# Patient Record
Sex: Male | Born: 1955 | Race: White | Hispanic: No | Marital: Married | Smoking: Former smoker
Health system: Southern US, Community
[De-identification: ages and names within clinical notes are randomized; demographics above are authoritative.]

## PROBLEM LIST (undated history)

## (undated) DIAGNOSIS — Z87891 Personal history of nicotine dependence: Secondary | ICD-10-CM

## (undated) DIAGNOSIS — H269 Unspecified cataract: Secondary | ICD-10-CM

## (undated) HISTORY — PX: WISDOM TOOTH EXTRACTION: SHX21

## (undated) HISTORY — DX: Unspecified cataract: H26.9

## (undated) HISTORY — DX: Personal history of nicotine dependence: Z87.891

---

## 1991-05-26 HISTORY — PX: BACK SURGERY: SHX140

## 2003-07-06 ENCOUNTER — Emergency Department (HOSPITAL_COMMUNITY): Admission: EM | Admit: 2003-07-06 | Discharge: 2003-07-06 | Payer: Self-pay | Admitting: Emergency Medicine
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
## 2013-05-31 ENCOUNTER — Emergency Department (HOSPITAL_COMMUNITY)
Admission: EM | Admit: 2013-05-31 | Discharge: 2013-05-31 | Disposition: A | Discharge status: Home / Self Care | Payer: Worker's Compensation | Attending: Emergency Medicine | Admitting: Emergency Medicine

## 2013-05-31 ENCOUNTER — Encounter (HOSPITAL_COMMUNITY): Payer: Self-pay | Admitting: Emergency Medicine

## 2013-05-31 DIAGNOSIS — S0990XA Unspecified injury of head, initial encounter: Secondary | ICD-10-CM

## 2013-05-31 DIAGNOSIS — Y929 Unspecified place or not applicable: Secondary | ICD-10-CM | POA: Insufficient documentation

## 2013-05-31 DIAGNOSIS — Z87891 Personal history of nicotine dependence: Secondary | ICD-10-CM | POA: Insufficient documentation

## 2013-05-31 DIAGNOSIS — S060X9A Concussion with loss of consciousness of unspecified duration, initial encounter: Secondary | ICD-10-CM | POA: Insufficient documentation

## 2013-05-31 DIAGNOSIS — W208XXA Other cause of strike by thrown, projected or falling object, initial encounter: Secondary | ICD-10-CM | POA: Insufficient documentation

## 2013-05-31 DIAGNOSIS — Y9389 Activity, other specified: Secondary | ICD-10-CM | POA: Insufficient documentation

## 2013-05-31 NOTE — Discharge Instructions (Signed)
Head Injury, Adult °You have had a head injury that does not appear serious at this time. A concussion is a state of changed mental ability, usually from a blow to the head. You should take clear liquids for the rest of the day and then resume your regular diet. You should not take sedatives or alcoholic beverages for as long as directed by your caregiver after discharge. After injuries such as yours, most problems occur within the first 24 hours. °SYMPTOMS °These minor symptoms may be experienced after discharge: °· Memory difficulties. °· Dizziness. °· Headaches. °· Double vision. °· Hearing difficulties. °· Depression. °· Tiredness. °· Weakness. °· Difficulty with concentration. °If you experience any of these problems, you should not be alarmed. A concussion requires a few days for recovery. Many patients with head injuries frequently experience such symptoms. Usually, these problems disappear without medical care. If symptoms last for more than one day, notify your caregiver. See your caregiver sooner if symptoms are becoming worse rather than better. °HOME CARE INSTRUCTIONS  °· During the next 24 hours you must stay with someone who can watch you for the warning signs listed below. °Although it is unlikely that serious side effects will occur, you should be aware of signs and symptoms which may necessitate your return to this location. Side effects may occur up to 7  10 days following the injury. It is important for you to carefully monitor your condition and contact your caregiver or seek immediate medical attention if there is a change in your condition. °SEEK IMMEDIATE MEDICAL CARE IF:  °· There is confusion or drowsiness. °· You can not awaken the injured person. °· There is nausea (feeling sick to your stomach) or continued, forceful vomiting. °· You notice dizziness or unsteadiness which is getting worse, or inability to walk. °· You have convulsions or unconsciousness. °· You experience severe,  persistent headaches not relieved by over-the-counter or prescription medicines for pain. (Do not take aspirin as this impairs clotting abilities). Take other pain medications only as directed. °· You can not use arms or legs normally. °· There is clear or bloody discharge from the nose or ears. °MAKE SURE YOU:  °· Understand these instructions. °· Will watch your condition. °· Will get help right away if you are not doing well or get worse. °Document Released: 05/11/2005 Document Revised: 08/03/2011 Document Reviewed: 03/29/2009 °ExitCare® Patient Information ©2014 [employer]. ° °
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
## 2013-05-31 NOTE — ED Provider Notes (Signed)
Medical screening examination/treatment/procedure(s) were performed by non-physician practitioner and as supervising physician I was immediately available for consultation/collaboration.  EKG Interpretation    Date/Time:  Wednesday May 31 2013 11:26:23 EST Ventricular Rate:  62 PR Interval:  205 QRS Duration: 99 QT Interval:  400 QTC Calculation: 406 R Axis:   38 Text Interpretation:  Sinus rhythm Borderline prolonged PR interval No previous ECGs available Confirmed by Stevie Kern  MD, Jenny Reichmann [phone]) on 05/31/2013 3:15:03 PM             Babette Relic, MD 05/31/13 [phone]

## 2013-05-31 NOTE — ED Notes (Signed)
Pt denies n/v/use of bld thinners. Pt sts he is feeling fine, just has a little pain on right forehead, tender with palpation. Nad, skin warm and dry, resp e/u. Pt is a&ox4.
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
## 2019-12-20 ENCOUNTER — Other Ambulatory Visit: Payer: Self-pay

## 2019-12-20 ENCOUNTER — Ambulatory Visit (INDEPENDENT_AMBULATORY_CARE_PROVIDER_SITE_OTHER): Payer: BC Managed Care – PPO | Admitting: Physician Assistant

## 2019-12-20 ENCOUNTER — Encounter: Payer: Self-pay | Admitting: Physician Assistant

## 2019-12-20 VITALS — BP 130/80 | HR 68 | Temp 97.6°F | Ht 73.0 in | Wt 278.4 lb

## 2019-12-20 DIAGNOSIS — S90851A Superficial foreign body, right foot, initial encounter: Secondary | ICD-10-CM | POA: Diagnosis not present

## 2019-12-20 DIAGNOSIS — Z1211 Encounter for screening for malignant neoplasm of colon: Secondary | ICD-10-CM

## 2019-12-20 DIAGNOSIS — Z23 Encounter for immunization: Secondary | ICD-10-CM

## 2019-12-20 DIAGNOSIS — F1411 Cocaine abuse, in remission: Secondary | ICD-10-CM | POA: Insufficient documentation

## 2019-12-20 DIAGNOSIS — B351 Tinea unguium: Secondary | ICD-10-CM

## 2019-12-20 HISTORY — DX: Cocaine abuse, in remission: F14.11

## 2019-12-20 NOTE — Progress Notes (Signed)
Brandon Moss is a 64 y.o. male is here to establish care.  I acted as a Education administrator for [employer], PA-C Anselmo Pickler, LPN   History of Present Illness:   Chief Complaint  Patient presents with  . Foreign Body in Skin    HPI   Splinter Pt c/o splinter in right foot x 2 months. Pt's wife tried to get it out. Painful at times with walking. Felt like he cleaned the area well when it happened. Denies discharge from the area, severe pain, erythema. He is behind on his tetanus vaccine.  Thickened toenail Has fungus on R great toenail. Has been getting thicker with time. Has difficulty cutting the nail. Has tried OTC remedies without much relief.  Colonoscopy He is interested in scheduling colonoscopy.   There are no preventive care reminders to display for this patient.  Past Medical History:  Diagnosis Date  . 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 Former smoker      Social History   Tobacco Use  . Smoking status: Former Smoker    Types: Cigarettes  . Smokeless tobacco: Never Used  . Tobacco comment: quit 11 yrs ago  Vaping Use  . Vaping Use: Never used  Substance Use Topics  . Alcohol use: Yes    Comment: weekends 2-3 beers  . Drug use: No    Past Surgical History:  Procedure Laterality Date  . BACK SURGERY  1993    Family History  Problem Relation Age of Onset  . Hypertension Mother   . Kidney cancer Mother   . Heart attack Mother   . Depression Mother   . Post-traumatic stress disorder Father     PMHx, SurgHx, SocialHx, FamHx, Medications, and Allergies were reviewed in the Visit Navigator and updated as appropriate.   There are no problems to display for this patient.   Social History   Tobacco Use  . Smoking status: Former Smoker    Types: Cigarettes  . Smokeless tobacco: Never Used  . Tobacco comment: quit 11 yrs ago  Vaping Use  . Vaping Use: Never used  Substance Use Topics  . Alcohol use: Yes    Comment: weekends 2-3  beers  . Drug use: No    Current Medications and Allergies:   No current outpatient medications on file.  No Known Allergies  Review of Systems   ROS  Negative unless otherwise specified per HPI.  Vitals:   Vitals:   12/20/19 1026  BP: (!) 130/80  Pulse: 68  Temp: 97.6 F (36.4 C)  TempSrc: Temporal  SpO2: 96%  Weight: (!) 278 lb 6.1 oz (126.3 kg)  Height: 6\' 1"  (1.854 m)     Body mass index is 36.73 kg/m.   Physical Exam:    Physical Exam Vitals and nursing note reviewed.  Constitutional:      Appearance: He is well-developed.  HENT:     Head: Normocephalic.  Eyes:     Conjunctiva/sclera: Conjunctivae normal.     Pupils: Pupils are equal, round, and reactive to light.  Pulmonary:     Effort: Pulmonary effort is normal.  Musculoskeletal:        General: Normal range of motion.     Cervical back: Normal range of motion.  Feet:     Comments: Tiny scab to plantar surface of base of fifth toe; thickened callous at this site  Thickened R great toenail Skin:    General: Skin is warm and dry.  Neurological:  Mental Status: He is alert and oriented to person, place, and time.  Psychiatric:        Behavior: Behavior normal.        Thought Content: Thought content normal.        Judgment: Judgment normal.    Removal of foreign body Meds, vitals, and allergies reviewed.  Indication: possible foreign body Pt complaints of: pain to foot Location: base of fifth toe on plantar surface of R foot Size: 17mm  Informed consent obtained.  Pt aware of risks not limited to but including infection, bleeding, damage to near by organs.  Prep: etoh  Scab removed with #11 blade. No blood loss.    Assessment and Plan:    Brandon Moss was seen today for foreign body in skin.  Diagnoses and all orders for this visit:  Splinter of right foot without infection, initial encounter Tolerated well. Unsure if foreign body completely removed. Offered xray and/or podiatry  referral, he declined and would like to see how he progresses with this intervention today. -     Tdap vaccine greater than or equal to 7yo IM  Toenail fungus Trial topical urea cream to see if this helps with thinning out nail.  Special screening for malignant neoplasms, colon -     Ambulatory referral to Gastroenterology    . Reviewed expectations re: course of current medical issues. . Discussed self-management of symptoms. . Outlined signs and symptoms indicating need for more acute intervention. . Patient verbalized understanding and all questions were answered. . See orders for this visit as documented in the electronic medical record. . Patient received an After Visit Summary.  CMA or LPN served as scribe during this visit. History, Physical, and Plan performed by medical provider. The above documentation has been reviewed and is accurate and complete.  Inda Coke, PA-C [place] 12/20/2019  Follow-up: No follow-ups on file.

## 2019-12-20 NOTE — Patient Instructions (Signed)
It was great to see you!     Trial this on your toenails -- use until desired thinness.  You will be contacted about your colonoscopy.  Follow-up at your convenience for a physical.  Take care,  Inda Coke PA-C

## 2020-01-12 ENCOUNTER — Encounter: Payer: Self-pay | Admitting: Physician Assistant

## 2020-01-12 ENCOUNTER — Ambulatory Visit (INDEPENDENT_AMBULATORY_CARE_PROVIDER_SITE_OTHER): Payer: BC Managed Care – PPO | Admitting: Physician Assistant

## 2020-01-12 ENCOUNTER — Other Ambulatory Visit: Payer: Self-pay

## 2020-01-12 VITALS — BP 160/90 | HR 70 | Temp 97.3°F | Ht 73.0 in | Wt 278.0 lb

## 2020-01-12 DIAGNOSIS — Z114 Encounter for screening for human immunodeficiency virus [HIV]: Secondary | ICD-10-CM | POA: Diagnosis not present

## 2020-01-12 DIAGNOSIS — E669 Obesity, unspecified: Secondary | ICD-10-CM | POA: Diagnosis not present

## 2020-01-12 DIAGNOSIS — R030 Elevated blood-pressure reading, without diagnosis of hypertension: Secondary | ICD-10-CM

## 2020-01-12 DIAGNOSIS — Z13220 Encounter for screening for lipoid disorders: Secondary | ICD-10-CM | POA: Diagnosis not present

## 2020-01-12 DIAGNOSIS — Z0001 Encounter for general adult medical examination with abnormal findings: Secondary | ICD-10-CM | POA: Diagnosis not present

## 2020-01-12 DIAGNOSIS — Z1211 Encounter for screening for malignant neoplasm of colon: Secondary | ICD-10-CM | POA: Diagnosis not present

## 2020-01-12 DIAGNOSIS — Z136 Encounter for screening for cardiovascular disorders: Secondary | ICD-10-CM

## 2020-01-12 DIAGNOSIS — Z1159 Encounter for screening for other viral diseases: Secondary | ICD-10-CM | POA: Diagnosis not present

## 2020-01-12 DIAGNOSIS — Z125 Encounter for screening for malignant neoplasm of prostate: Secondary | ICD-10-CM

## 2020-01-12 NOTE — Patient Instructions (Addendum)
It was great to see you!  1. Try to get on the treadmill weekly! Or go to the gym with your son! 2. I will resubmit your colonoscopy request. 3. Keep an eye on your blood pressure, if consistently >140/90, come back to see me!  Please go to the lab for blood work.   Our office will call you with your results unless you have chosen to receive results via MyChart.  If your blood work is normal we will follow-up each year for physicals and as scheduled for chronic medical problems.  If anything is abnormal we will treat accordingly and get you in for a follow-up.  Take care,  [facility] Maintenance, Male Adopting a healthy lifestyle and getting preventive care are important in promoting health and wellness. Ask your health care provider about:  The right schedule for you to have regular tests and exams.  Things you can do on your own to prevent diseases and keep yourself healthy. What should I know about diet, weight, and exercise? Eat a healthy diet   Eat a diet that includes plenty of vegetables, fruits, low-fat dairy products, and lean protein.  Do not eat a lot of foods that are high in solid fats, added sugars, or sodium. Maintain a healthy weight Body mass index (BMI) is a measurement that can be used to identify possible weight problems. It estimates body fat based on height and weight. Your health care provider can help determine your BMI and help you achieve or maintain a healthy weight. Get regular exercise Get regular exercise. This is one of the most important things you can do for your health. Most adults should:  Exercise for at least 150 minutes each week. The exercise should increase your heart rate and make you sweat (moderate-intensity exercise).  Do strengthening exercises at least twice a week. This is in addition to the moderate-intensity exercise.  Spend less time sitting. Even light physical activity can be beneficial. Watch cholesterol and blood  lipids Have your blood tested for lipids and cholesterol at 62 years of age, then have this test every 5 years. You may need to have your cholesterol levels checked more often if:  Your lipid or cholesterol levels are high.  You are older than 62 years of age.  You are at high risk for heart disease. What should I know about cancer screening? Many types of cancers can be detected early and may often be prevented. Depending on your health history and family history, you may need to have cancer screening at various ages. This may include screening for:  Colorectal cancer.  Prostate cancer.  Skin cancer.  Lung cancer. What should I know about heart disease, diabetes, and high blood pressure? Blood pressure and heart disease  High blood pressure causes heart disease and increases the risk of stroke. This is more likely to develop in people who have high blood pressure readings, are of African descent, or are overweight.  Talk with your health care provider about your target blood pressure readings.  Have your blood pressure checked: ? Every 3-5 years if you are 19-57 years of age. ? Every year if you are 65 years old or older.  If you are between the ages of 38 and 34 and are a current or former smoker, ask your health care provider if you should have a one-time screening for abdominal aortic aneurysm (AAA). Diabetes Have regular diabetes screenings. This checks your fasting blood sugar level. Have the screening  done:  Once every three years after age 49 if you are at a normal weight and have a low risk for diabetes.  More often and at a younger age if you are overweight or have a high risk for diabetes. What should I know about preventing infection? Hepatitis B If you have a higher risk for hepatitis B, you should be screened for this virus. Talk with your health care provider to find out if you are at risk for hepatitis B infection. Hepatitis C Blood testing is recommended  for:  Everyone born from [birth year] through 1963.  Anyone with known risk factors for hepatitis C. Sexually transmitted infections (STIs)  You should be screened each year for STIs, including gonorrhea and chlamydia, if: ? You are sexually active and are younger than 62 years of age. ? You are older than 62 years of age and your health care provider tells you that you are at risk for this type of infection. ? Your sexual activity has changed since you were last screened, and you are at increased risk for chlamydia or gonorrhea. Ask your health care provider if you are at risk.  Ask your health care provider about whether you are at high risk for HIV. Your health care provider may recommend a prescription medicine to help prevent HIV infection. If you choose to take medicine to prevent HIV, you should first get tested for HIV. You should then be tested every 3 months for as long as you are taking the medicine. Follow these instructions at home: Lifestyle  Do not use any products that contain nicotine or tobacco, such as cigarettes, e-cigarettes, and chewing tobacco. If you need help quitting, ask your health care provider.  Do not use street drugs.  Do not share needles.  Ask your health care provider for help if you need support or information about quitting drugs. Alcohol use  Do not drink alcohol if your health care provider tells you not to drink.  If you drink alcohol: ? Limit how much you have to 0-2 drinks a day. ? Be aware of how much alcohol is in your drink. In the [place] one drink equals one 12 oz bottle of beer (355 mL), one 5 oz glass of wine (148 mL), or one 1 oz glass of hard liquor (44 mL). General instructions  Schedule regular health, dental, and eye exams.  Stay current with your vaccines.  Tell your health care provider if: ? You often feel depressed. ? You have ever been abused or do not feel safe at home. Summary  Adopting a healthy lifestyle and getting  preventive care are important in promoting health and wellness.  Follow your health care provider's instructions about healthy diet, exercising, and getting tested or screened for diseases.  Follow your health care provider's instructions on monitoring your cholesterol and blood pressure. This information is not intended to replace advice given to you by your health care provider. Make sure you discuss any questions you have with your health care provider. Document Revised: 05/04/2018 Document Reviewed: 05/04/2018 Elsevier Patient Education  2020 [employer].

## 2020-01-12 NOTE — Progress Notes (Signed)
I acted as a Education administrator for [employer], PA-C Anselmo Pickler, LPN   Subjective:    Brandon Moss is a 64 y.o. male and is here for a comprehensive physical exam.   HPI  There are no preventive care reminders to display for this patient.  Acute Concerns: None  Chronic Issues: Elevated blood pressure  -- Currently taking no medications. At home blood pressure readings are: not checked. Patient denies chest pain, SOB, blurred vision, dizziness, unusual headaches, lower leg swelling. Patient is compliant with medication. Denies excessive caffeine intake, stimulant usage, excessive alcohol intake, or increase in salt consumption.  BP Readings from Last 3 Encounters:  01/12/20 (!) 160/90  12/20/19 (!) 130/80  05/31/13 131/83   Health Maintenance: Immunizations -- UTD Colonoscopy -- overdue PSA -- will update today Diet -- has been trying to do keto Caffeine intake -- a few large cups daily Sleep habits -- no concerns Exercise -- none; has treadmill in the house that he doesn't use Weight -- Weight: 278 lb (126.1 kg)  Recent weight history Wt Readings from Last 10 Encounters:  01/12/20 278 lb (126.1 kg)  12/20/19 (!) 278 lb 6.1 oz (126.3 kg)  05/31/13 265 lb (120.2 kg)   Body mass index is 36.68 kg/m. Mood -- fair Alcohol use -- doesn't drink daily; no concerns with alcohol intake Tobacco use -- none  Depression screen PHQ 2/9 01/12/2020  Decreased Interest 0  Down, Depressed, Hopeless 0  PHQ - 2 Score 0    Other providers/specialists: Patient Care Team: Inda Coke, [place] as PCP - General (Physician Assistant)    PMHx, SurgHx, SocialHx, Medications, and Allergies were reviewed in the Visit Navigator and updated as appropriate.   Past Medical History:  Diagnosis Date  . Cocaine abuse in remission ([place]) 12/20/2019  . Former smoker      Past Surgical History:  Procedure Laterality Date  . BACK SURGERY  1993     Family History  Problem Relation Age  of Onset  . Hypertension Mother   . Kidney cancer Mother   . Heart attack Mother   . Depression Mother   . Post-traumatic stress disorder Father     Social History   Tobacco Use  . Smoking status: Former Smoker    Types: Cigarettes  . Smokeless tobacco: Never Used  . Tobacco comment: quit 11 yrs ago  Vaping Use  . Vaping Use: Never used  Substance Use Topics  . Alcohol use: Yes    Comment: weekends 2-3 beers  . Drug use: No    Review of Systems:   Review of Systems  Constitutional: Negative for chills, fever, malaise/fatigue and weight loss.  HENT: Negative for hearing loss, sinus pain and sore throat.   Eyes: Negative for blurred vision.  Respiratory: Negative for cough and shortness of breath.   Cardiovascular: Negative for chest pain, palpitations and leg swelling.  Gastrointestinal: Negative for abdominal pain, constipation, diarrhea, heartburn, nausea and vomiting.  Genitourinary: Negative for dysuria, frequency and urgency.  Musculoskeletal: Negative for back pain, myalgias and neck pain.  Skin: Negative for itching and rash.  Neurological: Negative for dizziness, tingling, seizures, loss of consciousness and headaches.  Endo/Heme/Allergies: Negative for polydipsia.  Psychiatric/Behavioral: Negative for depression. The patient is not nervous/anxious.   All other systems reviewed and are negative.   Objective:    Vitals:   01/12/20 0804  BP: (!) 160/90  Pulse: 70  Temp: (!) 97.3 F (36.3 C)  SpO2: 95%   Body  mass index is 36.68 kg/m.  General  Alert, cooperative, no distress, appears stated age  Head:  Normocephalic, without obvious abnormality, atraumatic  Eyes:  PERRL, conjunctiva/corneas clear, EOM's intact, fundi benign, both eyes       Ears:  Normal TM's and external ear canals, both ears  Nose: Nares normal, septum midline, mucosa normal, no drainage or sinus tenderness  Throat: Lips, mucosa, and tongue normal; teeth and gums normal  Neck:  Supple, symmetrical, trachea midline, no adenopathy;     thyroid:  No enlargement/tenderness/nodules; no carotid bruit or JVD  Back:   Symmetric, no curvature, ROM normal, no CVA tenderness  Lungs:   Clear to auscultation bilaterally, respirations unlabored  Chest wall:  No tenderness or deformity  Heart:  Regular rate and rhythm, S1 and S2 normal, no murmur, rub or gallop  Abdomen:   Soft, non-tender, bowel sounds active all four quadrants, no masses, no organomegaly  Extremities: Extremities normal, atraumatic, no cyanosis or edema  Prostate : Not done   Skin: Skin color, texture, turgor normal, no rashes or lesions  Lymph nodes: Cervical, supraclavicular, and axillary nodes normal  Neurologic: CNII-XII grossly intact. Normal strength, sensation and reflexes throughout   No results found for this or any previous visit.  AssessmentPlan:   [place] was seen today for annual exam.  Diagnoses and all orders for this visit:  Encounter for general adult medical examination with abnormal findings Today patient counseled on age appropriate routine health concerns for screening and prevention, each reviewed and up to date or declined. Immunizations reviewed and up to date or declined. Labs ordered and reviewed. Risk factors for depression reviewed and negative. Hearing function and visual acuity are intact. ADLs screened and addressed as needed. Functional ability and level of safety reviewed and appropriate. Education, counseling and referrals performed based on assessed risks today. Patient provided with a copy of personalized plan for preventive services.  Special screening for malignant neoplasms, colon -     Ambulatory referral to Gastroenterology  Encounter for lipid screening for cardiovascular disease -     Lipid panel; Future -     Lipid panel  Encounter for screening for other viral diseases -     Hepatitis C antibody; Future -     Hepatitis C antibody  Screening for HIV (human  immunodeficiency virus) -     HIV Antibody (routine testing w rflx); Future -     HIV Antibody (routine testing w rflx)  Obesity, unspecified classification, unspecified obesity type, unspecified whether serious comorbidity present Counseled; continue to work on diet. Encouraged use of treadmill and continued activity around the house. -     CBC with Differential/Platelet; Future -     Comprehensive metabolic panel; Future -     Comprehensive metabolic panel -     CBC with Differential/Platelet  Elevated blood pressure reading Encouraged weight and modest caffeine reduction.  He has the ability to check his BP at work, provided parameters on what would prompt return visit.  Prostate cancer screening -     PSA; Future -     PSA   Well Adult Exam: Labs ordered: Yes. Patient counseling was done. See below for items discussed. Discussed the patient's BMI.  The BMI BMI is not in the acceptable range; BMI management plan is completed Follow up as needed for acute illness. Colon Cancer: re-submitted referral today for patient to be contacted  Patient Counseling: [x]   Nutrition: Stressed importance of moderation in sodium/caffeine intake, saturated fat  and cholesterol, caloric balance, sufficient intake of fresh fruits, vegetables, and fiber  [x]   Stressed the importance of regular exercise.   []   Substance Abuse: Discussed cessation/primary prevention of tobacco, alcohol, or other drug use; driving or other dangerous activities under the influence; availability of treatment for abuse.   [x]   Injury prevention: Discussed safety belts, safety helmets, smoke detector, smoking near bedding or upholstery.   []   Sexuality: Discussed sexually transmitted diseases, partner selection, use of condoms, avoidance of unintended pregnancy  and contraceptive alternatives.   [x]   Dental health: Discussed importance of regular tooth brushing, flossing, and dental visits.  [x]   Health maintenance and  immunizations reviewed. Please refer to Health maintenance section.   CMA or LPN served as scribe during this visit. History, Physical, and Plan performed by medical provider. The above documentation has been reviewed and is accurate and complete.  Inda Coke, PA-C [place]

## 2020-01-15 LAB — COMPREHENSIVE METABOLIC PANEL
AG Ratio: 2 (calc) (ref 1.0–2.5)
ALT: 16 U/L (ref 9–46)
AST: 16 U/L (ref 10–35)
Albumin: 4.5 g/dL (ref 3.6–5.1)
Alkaline phosphatase (APISO): 43 U/L (ref 35–144)
BUN: 16 mg/dL (ref 7–25)
CO2: 26 mmol/L (ref 20–32)
Calcium: 9.6 mg/dL (ref 8.6–10.3)
Chloride: 105 mmol/L (ref 98–110)
Creat: 1.09 mg/dL (ref 0.70–1.25)
Globulin: 2.2 g/dL (calc) (ref 1.9–3.7)
Glucose, Bld: 99 mg/dL (ref 65–99)
Potassium: 4.4 mmol/L (ref 3.5–5.3)
Sodium: 139 mmol/L (ref 135–146)
Total Bilirubin: 0.8 mg/dL (ref 0.2–1.2)
Total Protein: 6.7 g/dL (ref 6.1–8.1)

## 2020-01-15 LAB — PSA: PSA: 1.4 ng/mL (ref <=4.0)

## 2020-01-15 LAB — CBC WITH DIFFERENTIAL/PLATELET
Absolute Monocytes: 440 cells/uL (ref 200–950)
Basophils Absolute: 62 cells/uL (ref 0–200)
Basophils Relative: 1.4 %
Eosinophils Absolute: 158 cells/uL (ref 15–500)
Eosinophils Relative: 3.6 %
HCT: 45.7 % (ref 38.5–50.0)
Hemoglobin: 15.3 g/dL (ref 13.2–17.1)
Lymphs Abs: 1223 cells/uL (ref 850–3900)
MCH: 28.5 pg (ref 27.0–33.0)
MCHC: 33.5 g/dL (ref 32.0–36.0)
MCV: 85.3 fL (ref 80.0–100.0)
MPV: 10.3 fL (ref 7.5–12.5)
Monocytes Relative: 10 %
Neutro Abs: 2517 cells/uL (ref 1500–7800)
Neutrophils Relative %: 57.2 %
Platelets: 263 10*3/uL (ref 140–400)
RBC: 5.36 10*6/uL (ref 4.20–5.80)
RDW: 13.5 % (ref 11.0–15.0)
Total Lymphocyte: 27.8 %
WBC: 4.4 10*3/uL (ref 3.8–10.8)

## 2020-01-15 LAB — LIPID PANEL
Cholesterol: 173 mg/dL (ref <200)
HDL: 53 mg/dL (ref >=40)
LDL Cholesterol (Calc): 100 mg/dL (calc) — ABNORMAL HIGH
Non-HDL Cholesterol (Calc): 120 mg/dL (calc) (ref <130)
Total CHOL/HDL Ratio: 3.3 (calc) (ref <5.0)
Triglycerides: 107 mg/dL (ref <150)

## 2020-01-15 LAB — HIV ANTIBODY (ROUTINE TESTING W REFLEX): HIV 1&2 Ab, 4th Generation: NONREACTIVE

## 2020-01-15 LAB — HEPATITIS C ANTIBODY
Hepatitis C Ab: NONREACTIVE
SIGNAL TO CUT-OFF: 0.02 (ref <1.00)

## 2020-01-16 ENCOUNTER — Encounter: Payer: Self-pay | Admitting: Gastroenterology

## 2020-03-05 ENCOUNTER — Other Ambulatory Visit: Payer: Self-pay

## 2020-03-05 ENCOUNTER — Ambulatory Visit (AMBULATORY_SURGERY_CENTER): Payer: Self-pay | Admitting: *Deleted

## 2020-03-05 VITALS — Ht 73.0 in | Wt 274.8 lb

## 2020-03-05 DIAGNOSIS — Z1211 Encounter for screening for malignant neoplasm of colon: Secondary | ICD-10-CM

## 2020-03-05 NOTE — Progress Notes (Signed)
Patient denies any allergies to egg or soy products. Patient denies complications with anesthesia/sedation.  Patient denies oxygen use at home and denies diet medications. Pamphlet given on a colonoscopy procedure.  Patient had both covid vaccinations, last one on 08/12/19. Booster vaccination on 03/05/20.

## 2020-03-06 ENCOUNTER — Encounter: Payer: Self-pay | Admitting: Gastroenterology

## 2020-03-19 ENCOUNTER — Encounter: Payer: Self-pay | Admitting: Gastroenterology

## 2020-03-19 ENCOUNTER — Other Ambulatory Visit: Payer: Self-pay

## 2020-03-19 ENCOUNTER — Ambulatory Visit (AMBULATORY_SURGERY_CENTER): Payer: BC Managed Care – PPO | Admitting: Gastroenterology

## 2020-03-19 ENCOUNTER — Telehealth: Payer: Self-pay | Admitting: Gastroenterology

## 2020-03-19 VITALS — BP 159/95 | HR 60 | Temp 96.9°F | Resp 13 | Ht 73.0 in | Wt 274.8 lb

## 2020-03-19 DIAGNOSIS — Z1211 Encounter for screening for malignant neoplasm of colon: Secondary | ICD-10-CM | POA: Diagnosis present

## 2020-03-19 DIAGNOSIS — D125 Benign neoplasm of sigmoid colon: Secondary | ICD-10-CM

## 2020-03-19 DIAGNOSIS — D128 Benign neoplasm of rectum: Secondary | ICD-10-CM | POA: Diagnosis not present

## 2020-03-19 DIAGNOSIS — D122 Benign neoplasm of ascending colon: Secondary | ICD-10-CM | POA: Diagnosis not present

## 2020-03-19 DIAGNOSIS — D124 Benign neoplasm of descending colon: Secondary | ICD-10-CM

## 2020-03-19 DIAGNOSIS — D123 Benign neoplasm of transverse colon: Secondary | ICD-10-CM

## 2020-03-19 DIAGNOSIS — D120 Benign neoplasm of cecum: Secondary | ICD-10-CM | POA: Diagnosis not present

## 2020-03-19 MED ORDER — SODIUM CHLORIDE 0.9 % IV SOLN: 500.0000 mL | INTRAVENOUS | Status: DC

## 2020-03-19 NOTE — Telephone Encounter (Signed)
Spoke with pt and explained that we send all of our pathology to [facility]- we have a contract with them, not at [facility]

## 2020-03-19 NOTE — Progress Notes (Signed)
Called to room to assist during endoscopic procedure.  Patient ID and intended procedure confirmed with present staff. Received instructions for my participation in the procedure from the performing physician.  

## 2020-03-19 NOTE — Telephone Encounter (Signed)
This needs to go to the [facility]  

## 2020-03-19 NOTE — Progress Notes (Signed)
Vitals by EW

## 2020-03-19 NOTE — Op Note (Signed)
[facility] Patient Name: Brandon Moss Procedure Date: 03/19/2020 8:41 AM MRN: [id number] Endoscopist: Justice Britain , MD Age: 64 Referring MD:  Date of Birth: 1955/10/09 Gender: Male Account #: [id number] Procedure:                Colonoscopy Indications:              Screening for colorectal malignant neoplasm, This                            is the patient's first colonoscopy Medicines:                Monitored Anesthesia Care Procedure:                Pre-Anesthesia Assessment:                           - Prior to the procedure, a History and Physical                            was performed, and patient medications and                            allergies were reviewed. The patient's tolerance of                            previous anesthesia was also reviewed. The risks                            and benefits of the procedure and the sedation                            options and risks were discussed with the patient.                            All questions were answered, and informed consent                            was obtained. Prior Anticoagulants: The patient has                            taken no previous anticoagulant or antiplatelet                            agents. ASA Grade Assessment: II - A patient with                            mild systemic disease. After reviewing the risks                            and benefits, the patient was deemed in                            satisfactory condition to undergo the procedure.  After obtaining informed consent, the colonoscope                            was passed under direct vision. Throughout the                            procedure, the patient's blood pressure, pulse, and                            oxygen saturations were monitored continuously. The                            Colonoscope was introduced through the anus and                            advanced to the the  cecum, identified by                            appendiceal orifice and ileocecal valve. The                            colonoscopy was performed without difficulty. The                            patient tolerated the procedure. The quality of the                            bowel preparation was adequate. The ileocecal                            valve, appendiceal orifice, and rectum were                            photographed. Scope In: 8:53:05 AM Scope Out: 9:42:06 AM Scope Withdrawal Time: 0 hours 43 minutes 29 seconds  Total Procedure Duration: 0 hours 49 minutes 1 second  Findings:                 The digital rectal exam findings include palpable                            rectal nodule and hemorrhoids.                           The colon (entire examined portion) revealed                            grossly excessive looping. Advancing the scope                            required changing the patient's position, using                            manual pressure, withdrawing and reinserting the  scope, straightening and shortening the scope to                            obtain bowel loop reduction and using scope torsion.                           A 30 mm polyp was found in the proximal ascending                            colon. The polyp was semi-sessile. Polypectomy was                            not attempted as there was going to be need for EM                            in hospital-based setting.                           19 sessile polyps were found in the rectum (2),                            sigmoid colon (4), descending colon (2), transverse                            colon (5), hepatic flexure (1), ascending colon (4)                            and cecum (1). The polyps were 2 to 10 mm in size.                            These polyps were removed with a cold snare.                            Resection and retrieval were complete.                            A 25 mm polyp was found in the ascending colon. The                            polyp was mixed lateral spreading. Preparations                            were made for mucosal resection. NBI imaging and                            White-light endoscopy was done to demarcate the                            borders of the lesion. Saline was injected to raise                            the lesion. Piecemeal mucosal resection using a  snare was performed. Resection and retrieval were                            complete. To prevent bleeding after mucosal                            resection, three hemostatic clips were successfully                            placed (MR conditional). There was no bleeding                            during, or at the end, of the procedure.                           A few small-mouthed diverticula were found in the                            recto-sigmoid colon and sigmoid colon.                           A 30 mm polyp was found in the sigmoid colon. The                            polyp was semi-sessile. Polypectomy was not                            attempted as there was going to be need for EM in                            hospital-based setting.                           A 25 mm polyp was found in the sigmoid colon. The                            polyp was pedunculated. Polypectomy was not                            attempted as there was going to be need for EM in                            hospital-based setting.                           Tatto placed in sigmoid colon for marking purposes.                           A 15 mm polyp was found in the rectum. The polyp                            was semi-sessile. Polypectomy was not attempted.  This corresponds to the rectal nodule palpated.                           Many sessile polyps were found in the rectum and                            recto-sigmoid  colon. The polyps were diminutive in                            size. Likely hyperplastic.                           Non-bleeding non-thrombosed internal hemorrhoids                            were found during retroflexion, during perianal                            exam and during digital exam. The hemorrhoids were                            Grade II (internal hemorrhoids that prolapse but                            reduce spontaneously). Complications:            No immediate complications. Estimated Blood Loss:     Estimated blood loss was minimal. Impression:               - Palpable rectal nodule and hemorrhoids found on                            digital rectal exam.                           - There was significant looping of the colon.                           - One 30 mm polyp in the proximal ascending colon.                            Resection not attempted due to need for EMR in                            hospital-based setting.                           - 18 2 to 10 mm polyps in the rectum, in the                            sigmoid colon, in the descending colon, in the                            transverse colon, at the hepatic flexure, in the  ascending colon and in the cecum, removed with a                            cold snare. Resected and retrieved.                           - One 25 mm polyp in the ascending colon, removed                            with mucosal resection. Resected and retrieved.                            Clips (MR conditional) were placed.                           - Diverticulosis in the recto-sigmoid colon and in                            the sigmoid colon.                           - One 30 mm polyp in the sigmoid colon. Resection                            not attempted due to need for EMR in hospital-based                            setting.                           - One 25 mm pedunculated polyp in the sigmoid                             colon. Resection not attempted due to need for EMR                            in hospital-based setting.                           - Tattoo placed in sigmoid colon for marking                            purposes.                           - One 15 mm polyp in the rectum - this is                            consistent with nodule noted. Resection not                            attempted due to need for EMR in hospital-based  setting.                           - Many diminutive polyps in the rectum and at the                            recto-sigmoid colon.                           - Non-bleeding non-thrombosed internal hemorrhoids. Recommendation:           - The patient will be observed post-procedure,                            until all discharge criteria are met.                           - Discharge patient to home.                           - Patient has a contact number available for                            emergencies. The signs and symptoms of potential                            delayed complications were discussed with the                            patient. Return to normal activities tomorrow.                            Written discharge instructions were provided to the                            patient.                           - High fiber diet.                           - Use FiberCon 1-2 tablets PO daily.                           - Continue present medications.                           - Monitor for signs/symptoms of bleeding,                            perforation, and infection. If issues please call                            our number to get further assistance as needed.                           - Await pathology results.                           -  Repeat colonoscopy within next 2-3 months for                            retreatment in hospital based setting.                           - The findings and  recommendations were discussed                            with the patient.                           - The findings and recommendations were discussed                            with the patient's family. Justice Britain, MD 03/19/2020 10:01:42 AM

## 2020-03-19 NOTE — Progress Notes (Signed)
To PACU, VSS. Report to [email or web address] 

## 2020-03-19 NOTE — Patient Instructions (Signed)
Please read handouts provided. ?Continue present medications. ?Await pathology results. ?High Fiber Diet. ?Use FiberCon 1-2 tablets daily. ? ? ?YOU HAD AN ENDOSCOPIC PROCEDURE TODAY AT THE [facility]:   Refer to the procedure report that was given to you for any specific questions about what was found during the examination.  If the procedure report does not answer your questions, please call your gastroenterologist to clarify.  If you requested that your care partner not be given the details of your procedure findings, then the procedure report has been included in a sealed envelope for you to review at your convenience later. ? ?YOU SHOULD EXPECT: Some feelings of bloating in the abdomen. Passage of more gas than usual.  Walking can help get rid of the air that was put into your GI tract during the procedure and reduce the bloating. If you had a lower endoscopy (such as a colonoscopy or flexible sigmoidoscopy) you may notice spotting of blood in your stool or on the toilet paper. If you underwent a bowel prep for your procedure, you may not have a normal bowel movement for a few days. ? ?Please Note:  You might notice some irritation and congestion in your nose or some drainage.  This is from the oxygen used during your procedure.  There is no need for concern and it should clear up in a day or so. ? ?SYMPTOMS TO REPORT IMMEDIATELY: ? ?Following lower endoscopy (colonoscopy or flexible sigmoidoscopy): ? Excessive amounts of blood in the stool ? Significant tenderness or worsening of abdominal pains ? Swelling of the abdomen that is new, acute ? Fever of 100?F or higher ? ? ?For urgent or emergent issues, a gastroenterologist can be reached at any hour by calling [phone]. ?Do not use MyChart messaging for urgent concerns.  ? ? ?DIET:  We do recommend a small meal at first, but then you may proceed to your regular diet.  Drink plenty of fluids but you should avoid alcoholic beverages for 24  hours. ? ?ACTIVITY:  You should plan to take it easy for the rest of today and you should NOT DRIVE or use heavy machinery until tomorrow (because of the sedation medicines used during the test).   ? ?FOLLOW UP: ?Our staff will call the number listed on your records 48-72 hours following your procedure to check on you and address any questions or concerns that you may have regarding the information given to you following your procedure. If we do not reach you, we will leave a message.  We will attempt to reach you two times.  During this call, we will ask if you have developed any symptoms of COVID 19. If you develop any symptoms (ie: fever, flu-like symptoms, shortness of breath, cough etc.) before then, please call [phone].  If you test positive for Covid 19 in the 2 weeks post procedure, please call and report this information to us.   ? ?If any biopsies were taken you will be contacted by phone or by letter within the next 1-3 weeks.  Please call us at [phone] if you have not heard about the biopsies in 3 weeks.  ? ? ?SIGNATURES/CONFIDENTIALITY: ?You and/or your care partner have signed paperwork which will be entered into your electronic medical record.  These signatures attest to the fact that that the information above on your After Visit Summary has been reviewed and is understood.  Full responsibility of the confidentiality of this discharge information lies with you and/or your   care-partner.  ?

## 2020-03-19 NOTE — Telephone Encounter (Signed)
Pt is requesting for the pathology report to be sent to [facility].

## 2020-03-21 ENCOUNTER — Telehealth: Payer: Self-pay

## 2020-03-21 NOTE — Telephone Encounter (Signed)
Left message on follow up call. 

## 2020-03-21 NOTE — Telephone Encounter (Signed)
  Follow up Call-  Call back number 03/19/2020  Post procedure Call Back phone  # [phone]  Permission to leave phone message Yes  Some recent data might be hidden     Patient questions:  Do you have a fever, pain , or abdominal swelling? No. Pain Score  0 *  Have you tolerated food without any problems? Yes.    Have you been able to return to your normal activities? Yes.    Do you have any questions about your discharge instructions: Diet   No. Medications  No. Follow up visit  No.  Do you have questions or concerns about your Care? No.  Actions: * If pain score is 4 or above: No action needed, pain <4.  1. Have you developed a fever since your procedure? no  2.   Have you had an respiratory symptoms (SOB or cough) since your procedure? no  3.   Have you tested positive for COVID 19 since your procedure no  4.   Have you had any family members/close contacts diagnosed with the COVID 19 since your procedure?  no   If yes to any of these questions please route to Joylene John, RN and Joella Prince, RN

## 2020-03-27 ENCOUNTER — Encounter: Payer: Self-pay | Admitting: Gastroenterology

## 2020-09-12 ENCOUNTER — Telehealth: Payer: Self-pay

## 2020-09-12 NOTE — Telephone Encounter (Signed)
-----   Message from Irving Copas., MD sent at 09/12/2020  1:03 PM EDT ----- Regarding: Follow-up Gabrielle Wak[place], Can please reach out to the patien and see how he is doing?  We need to get him scheduled for his colonoscopy with EMR in the hospital-based setting.  Thanks. GM

## 2020-09-13 ENCOUNTER — Other Ambulatory Visit: Payer: Self-pay

## 2020-09-13 DIAGNOSIS — K635 Polyp of colon: Secondary | ICD-10-CM

## 2020-09-13 NOTE — Telephone Encounter (Signed)
The pt has been scheduled for Colon EMR on 11/13/20 at 830 am COVID test on 11/08/20.  I have spoken to the pt and he has been advised.  I will also send the instructions to My Chart per pt preference.

## 2020-11-07 ENCOUNTER — Encounter (HOSPITAL_COMMUNITY): Payer: Self-pay | Admitting: Gastroenterology

## 2020-11-07 ENCOUNTER — Other Ambulatory Visit: Payer: Self-pay

## 2020-11-08 ENCOUNTER — Other Ambulatory Visit (HOSPITAL_COMMUNITY)
Admission: RE | Admit: 2020-11-08 | Discharge: 2020-11-08 | Disposition: A | Discharge status: Home / Self Care | Payer: BC Managed Care – PPO | Source: Ambulatory Visit | Attending: Gastroenterology | Admitting: Gastroenterology

## 2020-11-08 DIAGNOSIS — Z01812 Encounter for preprocedural laboratory examination: Secondary | ICD-10-CM | POA: Diagnosis present

## 2020-11-08 DIAGNOSIS — Z20822 Contact with and (suspected) exposure to covid-19: Secondary | ICD-10-CM | POA: Diagnosis not present

## 2020-11-08 LAB — SARS CORONAVIRUS 2 (TAT 6-24 HRS): SARS Coronavirus 2: NEGATIVE

## 2020-11-12 NOTE — Anesthesia Preprocedure Evaluation (Addendum)
Anesthesia Evaluation  Patient identified by MRN, date of birth, ID band Patient awake    Reviewed: Allergy & Precautions, NPO status , Patient's Chart, lab work & pertinent test results  History of Anesthesia Complications Negative for: history of anesthetic complications  Airway Mallampati: II  TM Distance: >3 FB Neck ROM: Full    Dental  (+) Dental Advisory Given, Teeth Intact   Pulmonary former smoker,    Pulmonary exam normal        Cardiovascular negative cardio ROS Normal cardiovascular exam     Neuro/Psych negative neurological ROS  negative psych ROS   GI/Hepatic negative GI ROS, (+)     substance abuse (sober since 1989)  cocaine use,   Endo/Other   Obesity   Renal/GU negative Renal ROS     Musculoskeletal negative musculoskeletal ROS (+)   Abdominal   Peds  Hematology negative hematology ROS (+)   Anesthesia Other Findings   Reproductive/Obstetrics                            Anesthesia Physical Anesthesia Plan  ASA: 2  Anesthesia Plan: MAC   Post-op Pain Management:    Induction: Intravenous  PONV Risk Score and Plan: 1 and Propofol infusion and Treatment may vary due to age or medical condition  Airway Management Planned: Nasal Cannula and Natural Airway  Additional Equipment: None  Intra-op Plan:   Post-operative Plan:   Informed Consent: I have reviewed the patients History and Physical, chart, labs and discussed the procedure including the risks, benefits and alternatives for the proposed anesthesia with the patient or authorized representative who has indicated his/her understanding and acceptance.       Plan Discussed with: CRNA and Anesthesiologist  Anesthesia Plan Comments:        Anesthesia Quick Evaluation

## 2020-11-13 ENCOUNTER — Ambulatory Visit (HOSPITAL_COMMUNITY): Payer: BC Managed Care – PPO | Admitting: Anesthesiology

## 2020-11-13 ENCOUNTER — Ambulatory Visit (HOSPITAL_COMMUNITY)
Admission: RE | Admit: 2020-11-13 | Discharge: 2020-11-13 | Disposition: A | Discharge status: Home / Self Care | Payer: BC Managed Care – PPO | Attending: Gastroenterology | Admitting: Gastroenterology

## 2020-11-13 ENCOUNTER — Other Ambulatory Visit: Payer: Self-pay

## 2020-11-13 ENCOUNTER — Encounter (HOSPITAL_COMMUNITY)
Admission: RE | Disposition: A | Discharge status: Home / Self Care | Payer: Self-pay | Source: Home / Self Care | Attending: Gastroenterology

## 2020-11-13 ENCOUNTER — Encounter (HOSPITAL_COMMUNITY): Payer: Self-pay | Admitting: Gastroenterology

## 2020-11-13 DIAGNOSIS — D128 Benign neoplasm of rectum: Secondary | ICD-10-CM | POA: Insufficient documentation

## 2020-11-13 DIAGNOSIS — D490 Neoplasm of unspecified behavior of digestive system: Secondary | ICD-10-CM

## 2020-11-13 DIAGNOSIS — K641 Second degree hemorrhoids: Secondary | ICD-10-CM | POA: Diagnosis not present

## 2020-11-13 DIAGNOSIS — D122 Benign neoplasm of ascending colon: Secondary | ICD-10-CM | POA: Insufficient documentation

## 2020-11-13 DIAGNOSIS — K5730 Diverticulosis of large intestine without perforation or abscess without bleeding: Secondary | ICD-10-CM | POA: Insufficient documentation

## 2020-11-13 DIAGNOSIS — Z86010 Personal history of colonic polyps: Secondary | ICD-10-CM | POA: Diagnosis present

## 2020-11-13 DIAGNOSIS — Z8249 Family history of ischemic heart disease and other diseases of the circulatory system: Secondary | ICD-10-CM | POA: Diagnosis not present

## 2020-11-13 DIAGNOSIS — Z8051 Family history of malignant neoplasm of kidney: Secondary | ICD-10-CM | POA: Diagnosis not present

## 2020-11-13 DIAGNOSIS — D120 Benign neoplasm of cecum: Secondary | ICD-10-CM | POA: Diagnosis not present

## 2020-11-13 DIAGNOSIS — D127 Benign neoplasm of rectosigmoid junction: Secondary | ICD-10-CM | POA: Insufficient documentation

## 2020-11-13 DIAGNOSIS — Z87891 Personal history of nicotine dependence: Secondary | ICD-10-CM | POA: Insufficient documentation

## 2020-11-13 DIAGNOSIS — K635 Polyp of colon: Secondary | ICD-10-CM

## 2020-11-13 HISTORY — PX: POLYPECTOMY: SHX5525

## 2020-11-13 HISTORY — PX: COLONOSCOPY WITH PROPOFOL: SHX5780

## 2020-11-13 HISTORY — PX: SUBMUCOSAL LIFTING INJECTION: SHX6855

## 2020-11-13 HISTORY — PX: HEMOSTASIS CLIP PLACEMENT: SHX6857

## 2020-11-13 HISTORY — PX: HEMOSTASIS CONTROL: SHX6838

## 2020-11-13 HISTORY — PX: ENDOSCOPIC MUCOSAL RESECTION: SHX6839

## 2020-11-13 HISTORY — PX: SUBMUCOSAL TATTOO INJECTION: SHX6856

## 2020-11-13 SURGERY — COLONOSCOPY WITH PROPOFOL
Anesthesia: Monitor Anesthesia Care

## 2020-11-13 MED ORDER — PHENYLEPHRINE 40 MCG/ML (10ML) SYRINGE FOR IV PUSH (FOR BLOOD PRESSURE SUPPORT)
PREFILLED_SYRINGE | INTRAVENOUS | Status: DC | PRN
  Administered 2020-11-13: 40 ug via INTRAVENOUS
  Administered 2020-11-13 (×2): 80 ug via INTRAVENOUS

## 2020-11-13 MED ORDER — SODIUM CHLORIDE (PF) 0.9 % IJ SOLN
PREFILLED_SYRINGE | INTRAMUSCULAR | Status: DC | PRN
  Administered 2020-11-13: 2 mL

## 2020-11-13 MED ORDER — LACTATED RINGERS IV SOLN: INTRAVENOUS | Status: DC | PRN

## 2020-11-13 MED ORDER — SPOT INK MARKER SYRINGE KIT
PACK | SUBMUCOSAL | Status: DC | PRN
  Administered 2020-11-13: 2 mL via SUBMUCOSAL

## 2020-11-13 MED ORDER — SPOT INK MARKER SYRINGE KIT
PACK | SUBMUCOSAL | Status: AC
  Filled 2020-11-13: qty 5

## 2020-11-13 MED ORDER — PROPOFOL 500 MG/50ML IV EMUL
INTRAVENOUS | Status: DC | PRN
  Administered 2020-11-13: 120 ug/kg/min via INTRAVENOUS

## 2020-11-13 MED ORDER — PROPOFOL 10 MG/ML IV BOLUS
INTRAVENOUS | Status: DC | PRN
  Administered 2020-11-13 (×2): 20 mg via INTRAVENOUS

## 2020-11-13 MED ORDER — LACTATED RINGERS IV SOLN
INTRAVENOUS | Status: DC
  Administered 2020-11-13: 1000 mL via INTRAVENOUS

## 2020-11-13 MED ORDER — SODIUM CHLORIDE 0.9 % IV SOLN: INTRAVENOUS | Status: DC

## 2020-11-13 MED ORDER — EPINEPHRINE 1 MG/10ML IJ SOSY
PREFILLED_SYRINGE | INTRAMUSCULAR | Status: AC
  Filled 2020-11-13: qty 10

## 2020-11-13 SURGICAL SUPPLY — 22 items

## 2020-11-13 NOTE — Transfer of Care (Signed)
Immediate Anesthesia Transfer of Care Note  Patient: Cris Talavera Speagle  Procedure(s) Performed: COLONOSCOPY WITH PROPOFOL ENDOSCOPIC MUCOSAL RESECTION POLYPECTOMY HEMOSTASIS CLIP PLACEMENT SUBMUCOSAL TATTOO INJECTION HEMOSTASIS CONTROL  Patient Location: Endoscopy Unit  Anesthesia Type:MAC  Level of Consciousness: drowsy and responds to stimulation  Airway & Oxygen Therapy: Patient Spontanous Breathing and Patient connected to face mask oxygen  Post-op Assessment: Report given to RN, Post -op Vital signs reviewed and stable and Patient moving all extremities  Post vital signs: Reviewed and stable  Last Vitals:  Vitals Value Taken Time  BP 156/95 11/13/20 1050  Temp 36.4 C 11/13/20 1050  Pulse 53 11/13/20 1052  Resp 17 11/13/20 1052  SpO2 100 % 11/13/20 1052  Vitals shown include unvalidated device data.  Last Pain:  Vitals:   11/13/20 1050  TempSrc: Axillary  PainSc:          Complications: No notable events documented.

## 2020-11-13 NOTE — Discharge Instructions (Signed)

## 2020-11-13 NOTE — Op Note (Signed)
[facility] Patient Name: Brandon Moss Procedure Date: 11/13/2020 MRN: [id number] Attending MD: Justice Britain , MD Date of Birth: March 31, 1956 CSN: [id number] Age: 65 Admit Type: Inpatient Procedure:                Colonoscopy Indications:              Excision of colonic polyps (found in 2021 with need                            for multiple EMRs) Providers:                Justice Britain, MD, Josie Dixon, RN, Ladona Ridgel, Technician, Caryl Pina CRNA Referring MD:             Inda Coke Medicines:                Monitored Anesthesia Care Complications:            No immediate complications. Estimated Blood Loss:     Estimated blood loss was minimal. Procedure:                Pre-Anesthesia Assessment:                           - Prior to the procedure, a History and Physical                            was performed, and patient medications and                            allergies were reviewed. The patient's tolerance of                            previous anesthesia was also reviewed. The risks                            and benefits of the procedure and the sedation                            options and risks were discussed with the patient.                            All questions were answered, and informed consent                            was obtained. Prior Anticoagulants: The patient has                            taken no previous anticoagulant or antiplatelet                            agents except for NSAID medication. ASA Grade  Assessment: III - A patient with severe systemic                            disease. After reviewing the risks and benefits,                            the patient was deemed in satisfactory condition to                            undergo the procedure.                           After obtaining informed consent, the colonoscope                             was passed under direct vision. Throughout the                            procedure, the patient's blood pressure, pulse, and                            oxygen saturations were monitored continuously. The                            CF-HQ190L ([id number]) Olympus colonoscope was                            introduced through the anus and advanced to the the                            cecum, identified by appendiceal orifice and                            ileocecal valve. The colonoscopy was technically                            difficult and complex due to significant looping.                            Successful completion of the procedure was aided by                            changing the patient to a supine position, using                            manual pressure, withdrawing and reinserting the                            scope, straightening and shortening the scope to                            obtain bowel loop reduction and using abdominal  binder. The patient tolerated the procedure. The                            quality of the bowel preparation was adequate. The                            ileocecal valve, appendiceal orifice, and rectum                            were photographed. Scope In: 9:24:12 AM Scope Out: 10:42:55 AM Scope Withdrawal Time: 1 hour 11 minutes 51 seconds  Total Procedure Duration: 1 hour 18 minutes 43 seconds  Findings:      The digital rectal exam findings include hemorrhoids. Pertinent       negatives include no palpable rectal lesions.      The colon (entire examined portion) revealed significantly excessive       looping.      Four sessile polyps were found in the recto-sigmoid colon (2),       transverse colon (1) and ileocecal valve (1). The polyps were 3 to 10 mm       in size. These polyps were removed with a cold snare. Resection and       retrieval were complete.      A 30 mm polyp was found in the proximal  ascending colon. The polyp was       granular lateral spreading. Preparations were made for mucosal       resection. NBI imaging and White-light endoscopy was done to demarcate       the borders of the lesion. Orise gel was injected to raise the lesion.       Initially I felt that we had the entire lesion in the snare for an       enbloc resection, but after initial removal, it was seen that we would       require a piecemeal resection. There was mild oozing that subsided after       completion of the resection. Resection and retrieval were complete.       Coagulation for tissue destruction using snare tip soft coagulation was       successful to tissue edge. To prevent bleeding after mucosal resection,       four hemostatic clips were successfully placed (MR conditional). There       was no bleeding at the end of the procedure. Area was tattooed with an       injection of Spot (carbon black) on contralateral wall for follow up       purposes in future.      A medium post mucosectomy scar was found in the ascending colon. There       was no evidence of the previous polyp.      A 28 mm polyp was found in the recto-sigmoid colon. The polyp was       pedunculated. Preparations were made for mucosal resection. NBI imaging       and White-light endoscopy was done to demaracte the borders of the       lesion. A 1:100,000 solution of epinephrine was injected to raise the       lesion and aid in hemostasis. An Olympus polyloop was placed over the       polyp to decrease risk of bleeding. I  allowed this to be in place for 15       minutes before I then proceeded with snare mucosal resection. Resection       and retrieval were complete. To further prevent bleeding after mucosal       resection, one hemostatic clip was successfully placed (MR conditional).       There was no bleeding during, or at the end, of the procedure.      A 24 mm polyp was found in the recto-sigmoid colon just distal to the        above noted lesion. The polyp was granular lateral spreading.       Preparations were made for mucosal resection. NBI imaging and       White-light endoscopy was done to demarcate the borders of the lesion.       Orise gel was injected to raise the lesion. Snare mucosal resection was       performed. Resection and retrieval were complete. Coagulation for tissue       destruction using snare tip soft coagulation was successful to the       tissue edge. To prevent bleeding after mucosal resection, two hemostatic       clips were successfully placed (MR conditional). There was no bleeding       during, or at the end, of the procedure.      A 15 mm polypoid lesion was found in the distal rectum. The lesion was       granular lateral spreading. No bleeding was present. Preparations were       made for mucosal resection. NBI imaging and White-light endoscopy was       done to demaracte the borders of the lesion. Orise gel was injected to       raise the lesion. Piecemeal mucosal resection using a cold snare was       performed. Resection and retrieval were complete.      A few small-mouthed diverticula were found in the recto-sigmoid colon       and sigmoid colon.      Normal mucosa was found in the entire colon otherwise.      Non-bleeding non-thrombosed internal hemorrhoids were found during       retroflexion, during perianal exam and during digital exam. The       hemorrhoids were Grade II (internal hemorrhoids that prolapse but reduce       spontaneously). Impression:               - Hemorrhoids found on digital rectal exam.                           - There was significant looping of the colon.                           - Four, 3 to 10 mm polyps at the recto-sigmoid                            colon, in the transverse colon and at the ileocecal                            valve, removed with a cold snare. Resected and  retrieved.                           - One  30 mm polyp in the proximal ascending colon,                            removed with piecemeal mucosal resection. Resected                            and retrieved. STSC. Clips (MR conditional) were                            placed. Tattooed area on contralateral wall for                            marking purposes in future.                           - Post mucosectomy scar in the ascending colon.                           - One 28 mm polyp at the recto-sigmoid colon,                            removed after epinephrine injection and polyloop                            placement. Mucosal resection performed. Resected                            and retrieved. Clip (MR conditional) was placed.                           - One 24 mm polyp at the recto-sigmoid colon,                            removed with mucosal resection. Resected and                            retrieved. STSC. Clips (MR conditional) were placed.                           - Polypoid lesion in the distal rectum. Complete                            removal was accomplished with cold piecemeal                            mucosal resection.                           - Diverticulosis in the recto-sigmoid colon and in                            the sigmoid colon.                           -  Normal mucosa in the entire examined colon                            otherwise.                           - Non-bleeding non-thrombosed internal hemorrhoids. Moderate Sedation:      Not Applicable - Patient had care per Anesthesia. Recommendation:           - The patient will be observed post-procedure,                            until all discharge criteria are met.                           - Discharge patient to home.                           - Patient has a contact number available for                            emergencies. The signs and symptoms of potential                            delayed complications were discussed with the                             patient. Return to normal activities tomorrow.                            Written discharge instructions were provided to the                            patient.                           - High fiber diet.                           - Use FiberCon 1-2 tablets PO daily.                           - Monitor for signs/symptoms of bleeding,                            perforation, and infection. If issues please call                            our number to get further assistance as needed.                            There will be some spotting noted today most likely.                           - Minimize all NSAID medications (Aleve, Motrin,  Ibuprofen, BC/Goody Powders, Naproxen) as able for                            at least 2-weeks.                           - Await pathology results.                           - Repeat colonoscopy in 6-9 months for surveillance.                           - The findings and recommendations were discussed                            with the patient.                           - The findings and recommendations were discussed                            with the patient's family. Procedure Code(s):        --- Professional ---                           [phone], Colonoscopy, flexible; with endoscopic                            mucosal resection                           45385, 34, Colonoscopy, flexible; with removal of                            tumor(s), polyp(s), or other lesion(s) by snare                            technique Diagnosis Code(s):        --- Professional ---                           K64.1, Second degree hemorrhoids                           K63.5, Polyp of colon                           Z98.890, Other specified postprocedural states                           D49.0, Neoplasm of unspecified behavior of                            digestive system                           K57.30, Diverticulosis of  large intestine without  perforation or abscess without bleeding CPT copyright 2019 American Medical Association. All rights reserved. The codes documented in this report are preliminary and upon coder review may  be revised to meet current compliance requirements. Justice Britain, MD 11/13/2020 11:09:58 AM Number of Addenda: 0

## 2020-11-13 NOTE — H&P (Signed)
GASTROENTEROLOGY PROCEDURE H&P NOTE   Primary Care Physician: Inda Coke, PA  HPI: Brandon Moss is a 65 y.o. male who presents for Colonoscopy for EMR attempt of multiple pedunculated and semi-sessile polyps last noted in 2021.  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[place])    in 1980s, clean and sober since 06/25/87   Former smoker    quit in 2010   Past Surgical History:  Procedure Laterality Date   [place] EXTRACTION     Current Facility-Administered Medications  Medication Dose Route Frequency Provider Last Rate Last Admin   0.9 %  sodium chloride infusion   Intravenous Continuous Mansouraty, Telford Nab., MD       lactated ringers infusion   Intravenous Continuous Mansouraty, Telford Nab., MD 10 mL/hr at 11/13/20 0740 1,000 mL at 11/13/20 0740   No Known Allergies Family History  Problem Relation Age of Onset   Hypertension Mother    Kidney cancer Mother    Heart attack Mother    Depression Mother    Post-traumatic stress disorder Father    Colon cancer Neg Hx    Rectal cancer Neg Hx    Stomach cancer Neg Hx    Social History   Socioeconomic History   Marital status: Married    Spouse name: Not on file   Number of children: Not on file   Years of education: Not on file   Highest education level: Not on file  Occupational History   Not on file  Tobacco Use   Smoking status: Former    Pack years: 0.00    Types: Cigarettes    Quit date: 2010    Years since quitting: 12.4   Smokeless tobacco: Never   Tobacco comments:    quit 11 yrs ago  Vaping Use   Vaping Use: Never used  Substance and Sexual Activity   Alcohol use: Yes    Comment: weekends 2-3 beers   Drug use: Not Currently    Types: Cocaine    Comment: Clean and sober since 06/25/87   Sexual activity: Yes  Other Topics Concern   Not on file  Social History Narrative   HS history teacher    Married   3 children -- 74, 66 ([place]), 76 year old   From [place], [place]   Social Determinants of Health   Financial Resource Strain: Not on file  Food Insecurity: Not on file  Transportation Needs: Not on file  Physical Activity: Not on file  Stress: Not on file  Social Connections: Not on file  Intimate Partner Violence: Not on file    Physical Exam: Vital signs in last 24 hours: Temp:  [98.2 F (36.8 C)] 98.2 F (36.8 C) (06/22 0734) Pulse Rate:  [62] 62 (06/22 0734) Resp:  [10] 10 (06/22 0734) BP: (175)/(93) 175/93 (06/22 0734) SpO2:  [97 %] 97 % (06/22 0734) Weight:  [117.9 kg] 117.9 kg (06/22 0734)   GEN: NAD EYE: Sclerae anicteric ENT: MMM CV: Non-tachycardic GI: Soft, NT/ND NEURO:  Alert & Oriented x 3  Lab Results: No results for input(s): WBC, HGB, HCT, PLT in the last 72 hours. BMET No results for input(s): NA, K, CL, CO2, GLUCOSE, BUN, CREATININE, CALCIUM in the last 72 hours. LFT No results for input(s): PROT, ALBUMIN, AST, ALT, ALKPHOS, BILITOT, BILIDIR, IBILI in the last 72 hours. PT/INR  No results for input(s): LABPROT, INR in the last 72 hours.   Impression / Plan: This is a 65 y.o.malewho presents for Colonoscopy for EMR attempt of multiple pedunculated and semi-sessile polyps last noted in 2021.  The risks and benefits of endoscopic evaluation were discussed with the patient; these include but are not limited to the risk of perforation, infection, bleeding, missed lesions, lack of diagnosis, severe illness requiring hospitalization, as well as anesthesia and sedation related illnesses.  The patient is agreeable to proceed.    Justice Britain, MD [facility] Advanced [facility]oscopy Office # [phone]

## 2020-11-13 NOTE — Anesthesia Postprocedure Evaluation (Signed)
Anesthesia Post Note  Patient: Colie Fugitt Kimball  Procedure(s) Performed: COLONOSCOPY WITH PROPOFOL ENDOSCOPIC MUCOSAL RESECTION POLYPECTOMY HEMOSTASIS CLIP PLACEMENT SUBMUCOSAL TATTOO INJECTION HEMOSTASIS CONTROL     Patient location during evaluation: PACU Anesthesia Type: MAC Level of consciousness: awake and alert Pain management: pain level controlled Vital Signs Assessment: post-procedure vital signs reviewed and stable Respiratory status: spontaneous breathing, nonlabored ventilation and respiratory function stable Cardiovascular status: stable and blood pressure returned to baseline Anesthetic complications: no   No notable events documented.  Last Vitals:  Vitals:   11/13/20 1110 11/13/20 1120  BP: (!) 178/96 (!) 172/73  Pulse: (!) 55 (!) 55  Resp: 11 (!) 21  Temp:    SpO2: 95% 97%    Last Pain:  Vitals:   11/13/20 1120  TempSrc:   PainSc: 0-No pain                 Audry Pili

## 2020-11-14 ENCOUNTER — Encounter (HOSPITAL_COMMUNITY): Payer: Self-pay | Admitting: Gastroenterology

## 2020-11-14 ENCOUNTER — Encounter: Payer: Self-pay | Admitting: Gastroenterology

## 2020-11-14 LAB — SURGICAL PATHOLOGY

## 2022-02-16 ENCOUNTER — Encounter: Payer: Self-pay | Admitting: *Deleted

## 2022-03-23 ENCOUNTER — Encounter: Payer: Self-pay | Admitting: Gastroenterology

## 2022-05-07 ENCOUNTER — Encounter: Payer: Self-pay | Admitting: *Deleted
